# Patient Record
Sex: Female | Born: 2000 | Race: White | Hispanic: No | Marital: Single | Smoking: Never smoker
Health system: Southern US, Community
[De-identification: ages and names within clinical notes are randomized; demographics above are authoritative.]

---
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
## 2022-07-07 ENCOUNTER — Other Ambulatory Visit: Payer: Self-pay | Admitting: Pediatrics

## 2022-07-07 DIAGNOSIS — R1031 Right lower quadrant pain: Secondary | ICD-10-CM

## 2022-07-10 ENCOUNTER — Ambulatory Visit
Admission: RE | Admit: 2022-07-10 | Discharge: 2022-07-10 | Disposition: A | Discharge status: Home / Self Care | Payer: BC Managed Care – PPO | Source: Ambulatory Visit | Attending: Pediatrics | Admitting: Pediatrics

## 2022-07-10 DIAGNOSIS — R1031 Right lower quadrant pain: Secondary | ICD-10-CM | POA: Diagnosis present

## 2025-01-09 ENCOUNTER — Ambulatory Visit: Admitting: Internal Medicine

## 2025-04-04 ENCOUNTER — Ambulatory Visit: Admitting: Internal Medicine
